# Patient Record
Sex: Male | Born: 1970
Health system: Southern US, Community
[De-identification: ages and names within clinical notes are randomized; demographics above are authoritative.]

## PROBLEM LIST (undated history)

## (undated) DIAGNOSIS — J45909 Unspecified asthma, uncomplicated: Secondary | ICD-10-CM

---

## 2013-07-25 ENCOUNTER — Ambulatory Visit: Payer: Self-pay

## 2013-11-02 DIAGNOSIS — J45909 Unspecified asthma, uncomplicated: Secondary | ICD-10-CM | POA: Insufficient documentation

## 2015-06-10 IMAGING — CR DG CHEST 2V
1 series · 2 of 2 positions shown · non-contrast
Comparison: None.

CLINICAL DATA: Cough, wheezing.

EXAM:
CHEST  2 VIEW

[Series 1: pa · 0.17mm/px · 2 of 2 slices shown]
[im 1/2]
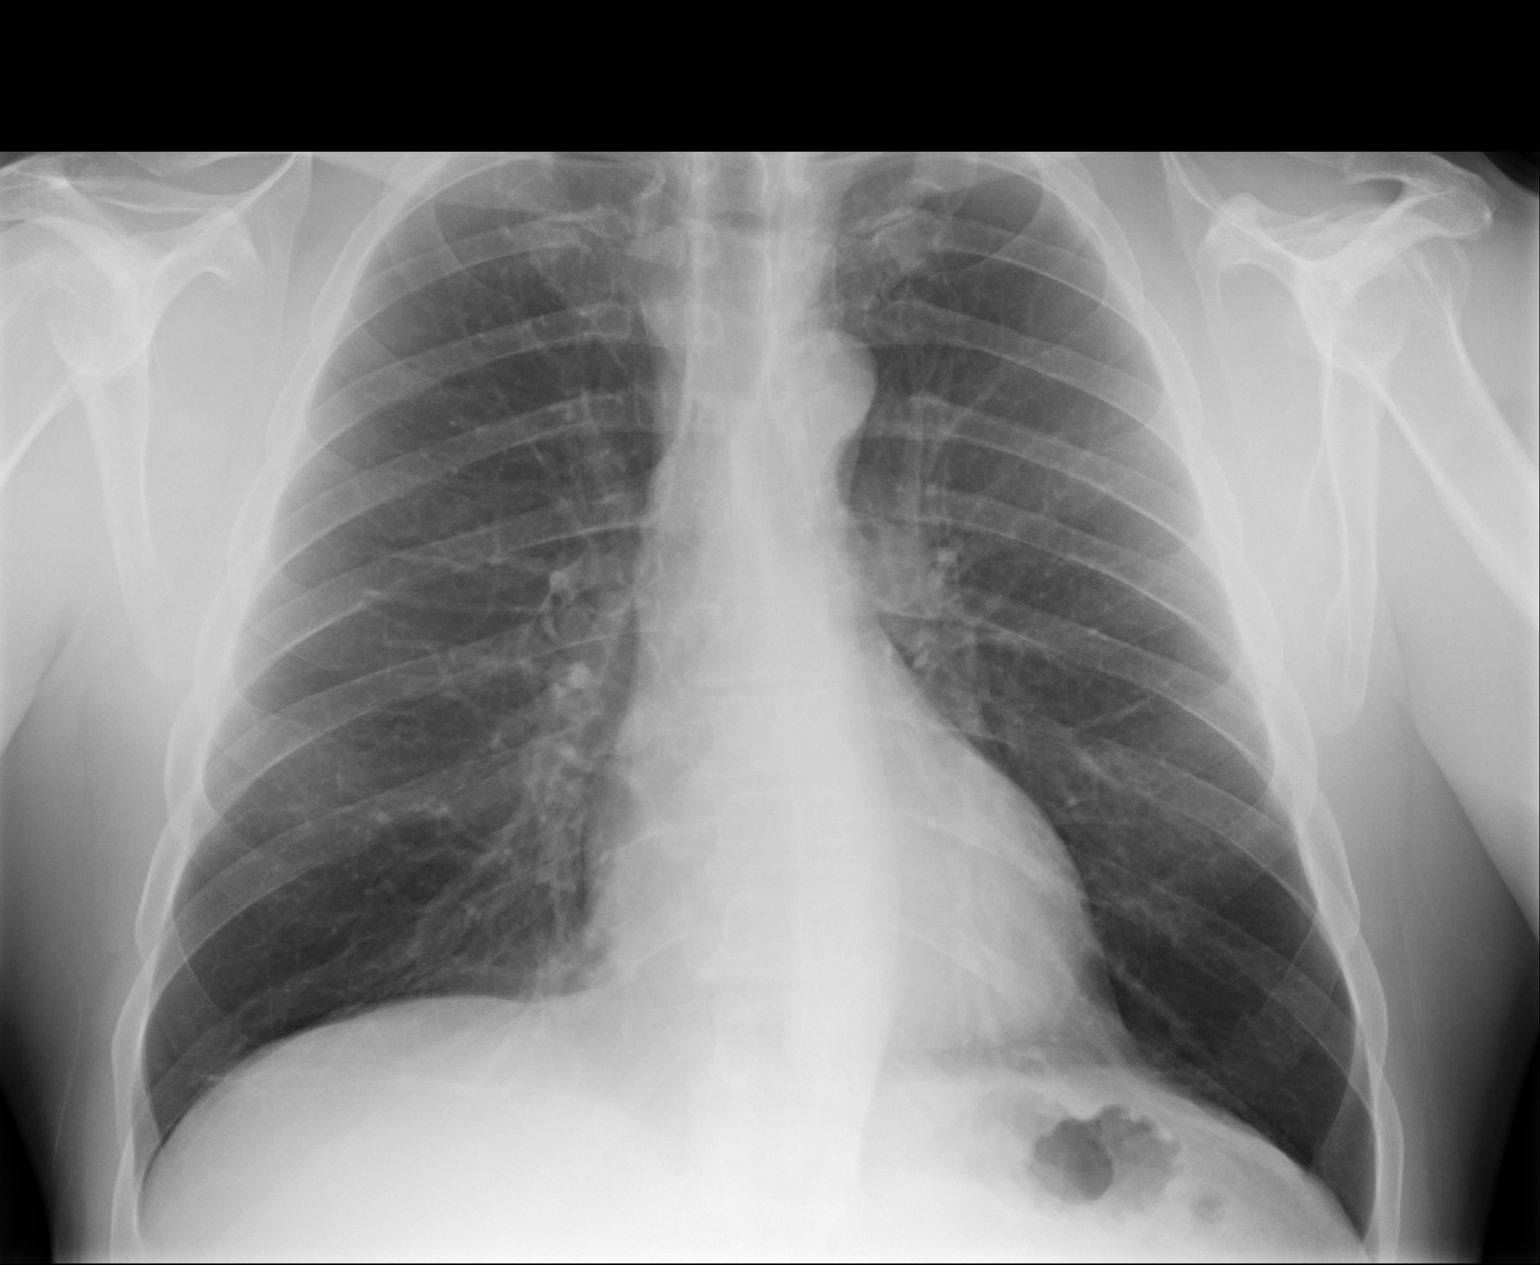
[im 2/2]
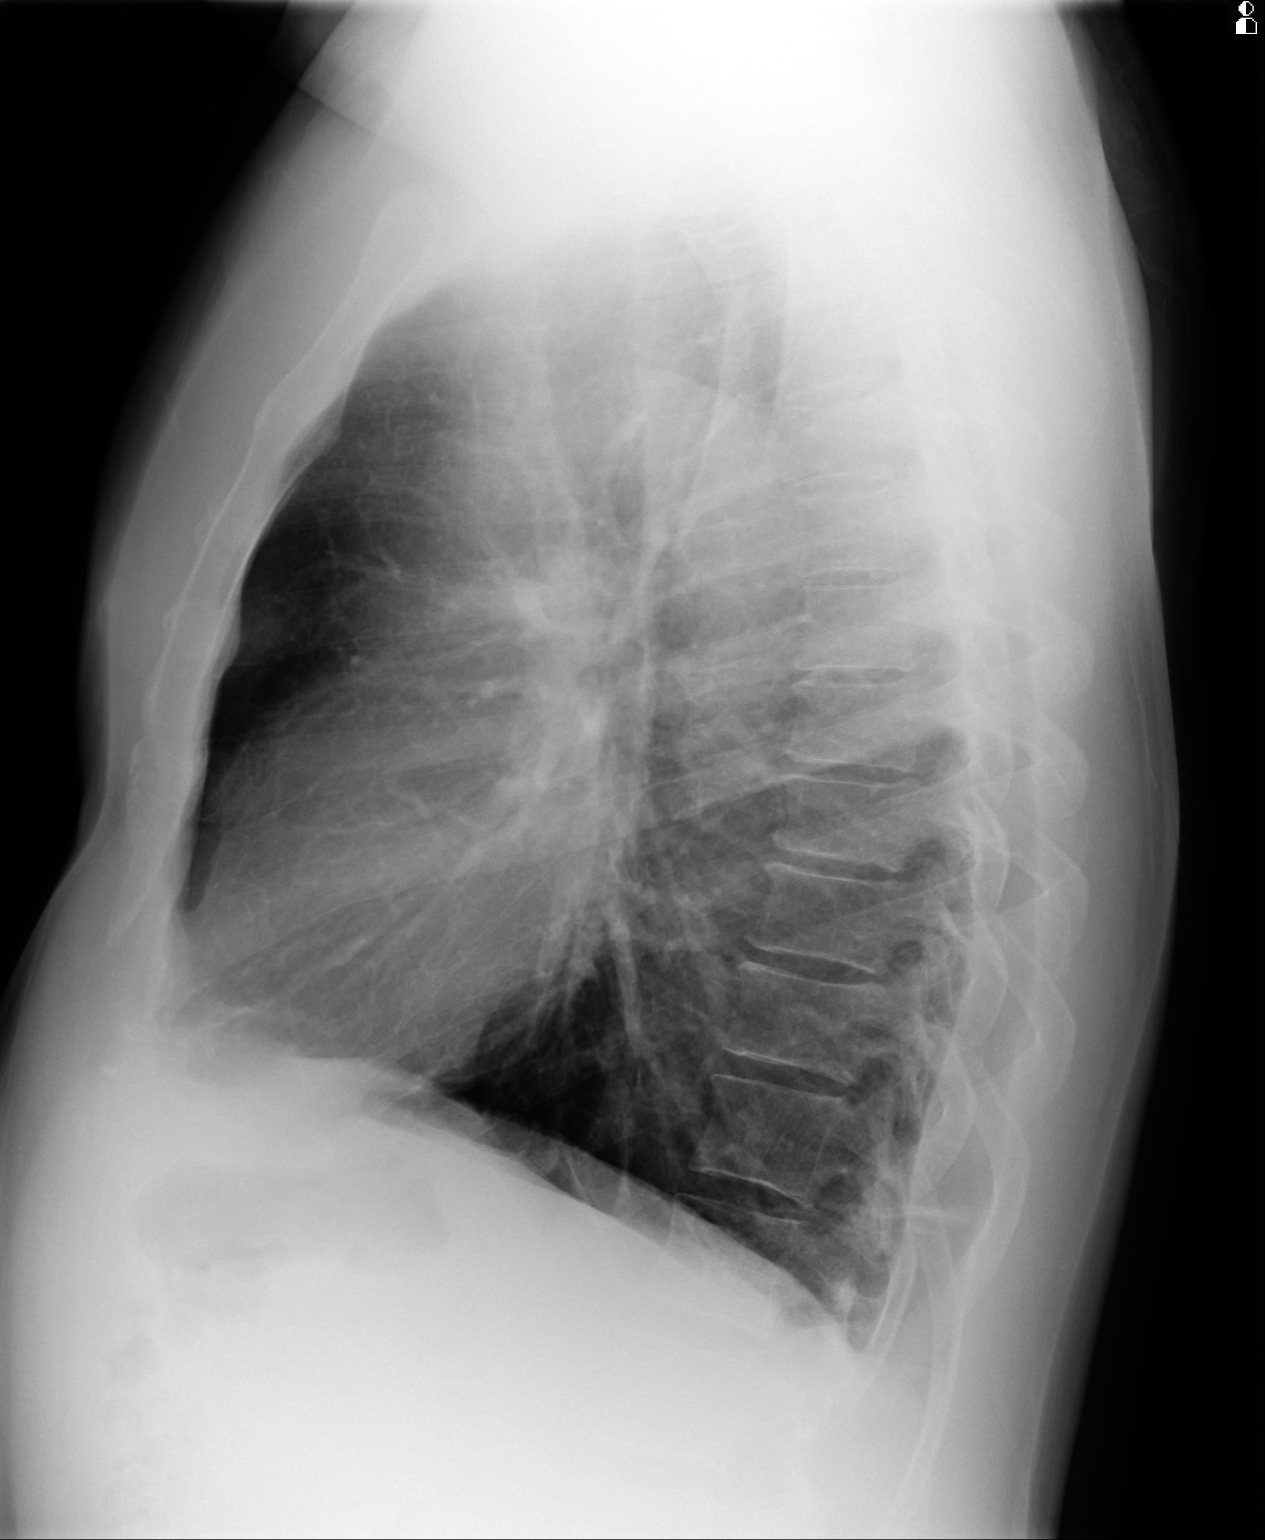

[2 of 2 positions shown; findings below may reference images not displayed]

FINDINGS: The heart size and mediastinal contours are within normal limits.
Both lungs are clear. The visualized skeletal structures are
unremarkable.
IMPRESSION: No acute cardiopulmonary abnormality seen.

## 2016-11-17 DIAGNOSIS — Z85820 Personal history of malignant melanoma of skin: Secondary | ICD-10-CM | POA: Insufficient documentation

## 2017-03-27 DIAGNOSIS — Z85820 Personal history of malignant melanoma of skin: Secondary | ICD-10-CM | POA: Diagnosis not present

## 2017-09-14 ENCOUNTER — Ambulatory Visit
Admission: EM | Admit: 2017-09-14 | Discharge: 2017-09-14 | Disposition: A | Discharge status: Home / Self Care | Payer: 59 | Attending: Family Medicine | Admitting: Family Medicine

## 2017-09-14 DIAGNOSIS — S90562A Insect bite (nonvenomous), left ankle, initial encounter: Secondary | ICD-10-CM | POA: Diagnosis not present

## 2017-09-14 DIAGNOSIS — W57XXXA Bitten or stung by nonvenomous insect and other nonvenomous arthropods, initial encounter: Secondary | ICD-10-CM

## 2017-09-14 DIAGNOSIS — Z23 Encounter for immunization: Secondary | ICD-10-CM | POA: Diagnosis not present

## 2017-09-14 DIAGNOSIS — L03116 Cellulitis of left lower limb: Secondary | ICD-10-CM | POA: Diagnosis not present

## 2017-09-14 MED ORDER — DOXYCYCLINE HYCLATE 100 MG PO CAPS: 100.0000 mg | ORAL_CAPSULE | Freq: Two times a day (BID) | ORAL | 0 refills | Status: DC

## 2017-09-14 MED ORDER — PREDNISONE 20 MG PO TABS: 40.0000 mg | ORAL_TABLET | Freq: Every day | ORAL | 0 refills | Status: DC

## 2017-09-14 MED ORDER — TETANUS-DIPHTH-ACELL PERTUSSIS 5-2.5-18.5 LF-MCG/0.5 IM SUSP
0.5000 mL | Freq: Once | INTRAMUSCULAR | Status: AC
  Administered 2017-09-14: 0.5 mL via INTRAMUSCULAR

## 2017-09-14 MED ORDER — MUPIROCIN 2 % EX OINT: TOPICAL_OINTMENT | CUTANEOUS | 0 refills | Status: AC

## 2017-09-14 NOTE — ED Provider Notes (Addendum)
[facility] ____________________________________________  Time seen: Approximately 7:47 PM  I have reviewed the triage vital signs and the nursing notes.  HISTORY  Chief Complaint Insect Bite  HPI Andrew Chase is a 47 y.o. male presented for evaluation of insect bite or sting to left ankle that is been present since last night.  Patient reports that he was walking outside going up some steps and felt a sudden sting to his posterior ankle.  States that he did see "dirt divers "in that area and suspects that is what he was stung by.  Denies any other insects.  States last night the area was itchy and had one small sting appearing area.  States even this morning the area was overall feeling well, but reports he works on his feet all day and noticed some swelling to the area with blisters. Reports did take benadryl today. Denies any other alleviating measures.  Denies any other insect bites or stings, no recent tick bites.  Denies fevers.  Reports otherwise feels well.  No chest pain, shortness of breath, difficulty swallowing, difficulty breathing, facial swelling, history of similar in the past.  States tetanus immunization last greater than 10 years ago.  States the area is minimally tender, denies other complaints.  No pain radiation paresthesias.Denies recent sickness. Denies recent antibiotic use.   Rusty Aus, MD: PCP   History reviewed. No pertinent past medical history.  Patient Active Problem List   Diagnosis Date Noted  . History of malignant melanoma 11/17/2016  . Reactive airway disease 11/02/2013    History reviewed. No pertinent surgical history.   No current facility-administered medications for this encounter.   Current Outpatient Medications:  .  fluticasone (FLONASE) 50 MCG/ACT nasal spray, Place into the nose., Disp: , Rfl:  .  doxycycline (VIBRAMYCIN) 100 MG capsule, Take 1 capsule (100 mg total) by mouth 2 (two) times daily., Disp: 20 capsule,  Rfl: 0 .  mupirocin ointment (BACTROBAN) 2 %, Apply two times a day for 7 days., Disp: 22 g, Rfl: 0 .  predniSONE (DELTASONE) 20 MG tablet, Take 2 tablets (40 mg total) by mouth daily., Disp: 10 tablet, Rfl: 0  Allergies Patient has no known allergies.  No family history on file.  Social History Social History   Tobacco Use  . Smoking status: Never Smoker  . Smokeless tobacco: Never Used  Substance Use Topics  . Alcohol use: Yes  . Drug use: Never    Review of Systems Constitutional: No fever/chills. Cardiovascular: Denies chest pain. Respiratory: Denies shortness of breath. Gastrointestinal: No abdominal pain.  Musculoskeletal: Negative for back pain. Skin: as above.  ____________________________________________   PHYSICAL EXAM:  VITAL SIGNS: ED Triage Vitals  Enc Vitals Group     BP 09/14/17 1842 129/82     Pulse Rate 09/14/17 1842 73     Resp 09/14/17 1842 18     Temp 09/14/17 1842 97.9 F (36.6 C)     Temp Source 09/14/17 1842 Oral     SpO2 09/14/17 1842 98 %     Weight --      Height --      Head Circumference --      Peak Flow --      Pain Score 09/14/17 1844 0     Pain Loc --      Pain Edu? --      Excl. in [place]? --     Constitutional: Alert and oriented. Well appearing and in no acute distress. ENT  Head: Normocephalic and atraumatic.. Cardiovascular: Normal rate, regular rhythm. Grossly normal heart sounds.  Good peripheral circulation. Respiratory: Normal respiratory effort without tachypnea nor retractions. Breath sounds are clear and equal bilaterally. No wheezes, rales, rhonchi. Musculoskeletal:  Steady gait. Bilateral pedal pulses equal and easily palpated. Neurologic:  Normal speech and language. No gross focal neurologic deficits are appreciated. Speech is normal. No gait instability.  Skin:  Skin is warm, dry.   Left posterior ankle with the above appearance, single punctum appearing area without foreign body appearance, mild serous  drainage and mild surrounding erythema and localized edema mildly, no other edema noted, left lower extremity otherwise nontender, no calf tenderness.  Full range of motion to bilateral lower extremities Psychiatric: Mood and affect are normal. Speech and behavior are normal. Patient exhibits appropriate insight and judgment   ___________________________________________   LABS (all labs ordered are listed, but only abnormal results are displayed)  Labs Reviewed - No data to display   PROCEDURES Procedures     INITIAL IMPRESSION / [place] / ED COURSE  Pertinent labs & imaging results that were available during my care of the patient were reviewed by me and considered in my medical decision making (see chart for details).  Well-appearing patient.  No acute distress.  Patient presenting for suspect of left ankle insect sting last night.  Suspect patient with local allergic reaction also with concern of secondary cellulitis.  Will treat with oral prednisone, doxycycline and topical Bactroban.  Discussed elevation, wound care and close monitoring.  Discussed strict follow-up and return parameters.Discussed indication, risks and benefits of medications with patient.tdap updated.   Discussed follow up with Primary care physician this week. Discussed follow up and return parameters including no resolution or any worsening concerns. Patient verbalized understanding and agreed to plan.   ____________________________________________   FINAL CLINICAL IMPRESSION(S) / ED DIAGNOSES  Final diagnoses:  Insect bite of left ankle, initial encounter  Reaction to insect bite  Cellulitis of left lower extremity     ED Discharge Orders        Ordered    predniSONE (DELTASONE) 20 MG tablet  Daily     09/14/17 1913    doxycycline (VIBRAMYCIN) 100 MG capsule  2 times daily     09/14/17 1913    mupirocin ointment (BACTROBAN) 2 %     09/14/17 1913       Note: This dictation was  prepared with Dragon dictation along with smaller phrase technology. Any transcriptional errors that result from this process are unintentional.         Marylene Land, NP 09/14/17 1953    Marylene Land, NP 09/14/17 1953

## 2017-09-14 NOTE — Discharge Instructions (Addendum)
Take medication as prescribed. Rest. Drink plenty of fluids. Keep clean. Elevate leg as discussed. Close monitoring.  Follow up with your primary care physician this week as needed. Return to Urgent care for new or worsening concerns.

## 2017-09-14 NOTE — ED Triage Notes (Signed)
Pt here for bite or sting on his left ankle that occurred last night. Unsure what got him but does have raised pus areas and patient did pop the blisters and now has some partially covered by a band aid. States it currently doesn't itch and did take benadryl a few times today.

## 2017-12-19 ENCOUNTER — Ambulatory Visit
Admission: EM | Admit: 2017-12-19 | Discharge: 2017-12-19 | Disposition: A | Discharge status: Home / Self Care | Payer: 59 | Attending: Family Medicine | Admitting: Family Medicine

## 2017-12-19 ENCOUNTER — Other Ambulatory Visit: Payer: Self-pay

## 2017-12-19 DIAGNOSIS — B9789 Other viral agents as the cause of diseases classified elsewhere: Secondary | ICD-10-CM

## 2017-12-19 DIAGNOSIS — J069 Acute upper respiratory infection, unspecified: Secondary | ICD-10-CM | POA: Diagnosis not present

## 2017-12-19 DIAGNOSIS — R0981 Nasal congestion: Secondary | ICD-10-CM

## 2017-12-19 HISTORY — DX: Unspecified asthma, uncomplicated: J45.909

## 2017-12-19 MED ORDER — DOXYCYCLINE HYCLATE 100 MG PO CAPS: 100.0000 mg | ORAL_CAPSULE | Freq: Two times a day (BID) | ORAL | 0 refills | Status: AC

## 2017-12-19 MED ORDER — ALBUTEROL SULFATE HFA 108 (90 BASE) MCG/ACT IN AERS: 1.0000 | INHALATION_SPRAY | Freq: Four times a day (QID) | RESPIRATORY_TRACT | 0 refills | Status: AC | PRN

## 2017-12-19 MED ORDER — HYDROCOD POLST-CPM POLST ER 10-8 MG/5ML PO SUER
5.0000 mL | Freq: Every evening | ORAL | 0 refills | Status: AC | PRN
Start: 2017-12-19 — End: ?

## 2017-12-19 MED ORDER — PSEUDOEPH-BROMPHEN-DM 30-2-10 MG/5ML PO SYRP: 5.0000 mL | ORAL_SOLUTION | Freq: Four times a day (QID) | ORAL | 0 refills | Status: AC | PRN

## 2017-12-19 MED ORDER — PREDNISONE 20 MG PO TABS: 40.0000 mg | ORAL_TABLET | Freq: Every day | ORAL | 0 refills | Status: AC

## 2017-12-19 NOTE — ED Triage Notes (Signed)
Pt with past few days of nasal congestion and blowing green from his nose. Denies pain

## 2017-12-19 NOTE — ED Provider Notes (Signed)
[facility]    CSN: [id number] Arrival date & time: 12/19/17  1036     History   Chief Complaint Chief Complaint  Patient presents with  . Nasal Congestion    HPI Andrew Chase is a 47 y.o. male presents to the urgent care facility for evaluation of nasal congestion.  He has had symptoms for 2 days.  States is been blowing green mucus from his nose.  He denies any sinus pain.  He has had a mild cough that is worse at nighttime, nonproductive.  He has had low-grade fevers but afebrile today.  He is been taking DayQuil, NyQuil and Afrin with some improvement of symptoms.  He denies any body aches, nausea, vomiting, diarrhea, skin rashes.  No headaches.  HPI  Past Medical History:  Diagnosis Date  . Asthma     Patient Active Problem List   Diagnosis Date Noted  . History of malignant melanoma 11/17/2016  . Reactive airway disease 11/02/2013    History reviewed. No pertinent surgical history.     Home Medications    Prior to Admission medications   Medication Sig Start Date End Date Taking? Authorizing Provider  albuterol (PROVENTIL HFA;VENTOLIN HFA) 108 (90 Base) MCG/ACT inhaler Inhale 1-2 puffs into the lungs every 6 (six) hours as needed for wheezing or shortness of breath. 12/19/17   Duanne Guess, PA-C  brompheniramine-pseudoephedrine-DM 30-2-10 MG/5ML syrup Take 5 mLs by mouth 4 (four) times daily as needed. 12/19/17   Duanne Guess, PA-C  chlorpheniramine-HYDROcodone (TUSSIONEX PENNKINETIC ER) 10-8 MG/5ML SUER Take 5 mLs by mouth at bedtime as needed for cough. 12/19/17   Duanne Guess, PA-C  doxycycline (VIBRAMYCIN) 100 MG capsule Take 1 capsule (100 mg total) by mouth 2 (two) times daily for 10 days. 12/19/17 12/29/17  Duanne Guess, PA-C  fluticasone (FLONASE) 50 MCG/ACT nasal spray Place into the nose. 11/12/15   [provider]  mupirocin ointment (BACTROBAN) 2 % Apply two times a day for 7 days. 09/14/17   Marylene Land, NP    predniSONE (DELTASONE) 20 MG tablet Take 2 tablets (40 mg total) by mouth daily. 12/19/17   Dorise Hiss C, PA-C  SYMBICORT 160-4.5 MCG/ACT inhaler INHALE 2 INHALATIONS INTO THE LUNGS 2 (TWO) TIMES DAILY. 12/03/17   [provider]    Family History History reviewed. No pertinent family history.  Social History Social History   Tobacco Use  . Smoking status: Former Smoker    Last attempt to quit: 12/20/1991    Years since quitting: 26.0  . Smokeless tobacco: Never Used  Substance Use Topics  . Alcohol use: Yes    Comment: social  . Drug use: Never     Allergies   Patient has no known allergies.   Review of Systems Review of Systems  Constitutional: Negative for fever.  HENT: Positive for congestion and rhinorrhea. Negative for sinus pressure, sinus pain and sore throat.   Respiratory: Positive for cough. Negative for wheezing and stridor.   Gastrointestinal: Negative for diarrhea, nausea and vomiting.  Musculoskeletal: Negative for arthralgias, myalgias and neck stiffness.  Skin: Negative for rash.  Neurological: Negative for dizziness.     Physical Exam Triage Vital Signs ED Triage Vitals  Enc Vitals Group     BP 12/19/17 1049 126/90     Pulse Rate 12/19/17 1049 88     Resp 12/19/17 1049 18     Temp 12/19/17 1049 98.6 F (37 C)     Temp Source  12/19/17 1049 Oral     SpO2 12/19/17 1049 95 %     Weight 12/19/17 1050 160 lb (72.6 kg)     Height 12/19/17 1050 5\' 6"  (1.676 m)     Head Circumference --      Peak Flow --      Pain Score 12/19/17 1050 0     Pain Loc --      Pain Edu? --      Excl. in [place]? --    No data found.  Updated Vital Signs BP 126/90 (BP Location: Left Arm)   Pulse 88   Temp 98.6 F (37 C) (Oral)   Resp 18   Ht 5\' 6"  (1.676 m)   Wt 160 lb (72.6 kg)   SpO2 95%   BMI 25.82 kg/m   Visual Acuity Right Eye Distance:   Left Eye Distance:   Bilateral Distance:    Right Eye Near:   Left Eye Near:    Bilateral Near:      Physical Exam  Constitutional: He is oriented to person, place, and time. He appears well-developed and well-nourished. No distress.  HENT:  Head: Normocephalic and atraumatic.  Right Ear: Hearing, tympanic membrane, external ear and ear canal normal.  Left Ear: Hearing, tympanic membrane, external ear and ear canal normal.  Nose: Rhinorrhea present. No nasal septal hematoma. Right sinus exhibits no maxillary sinus tenderness and no frontal sinus tenderness. Left sinus exhibits no maxillary sinus tenderness and no frontal sinus tenderness.  Mouth/Throat: No trismus in the jaw. No uvula swelling. No oropharyngeal exudate, posterior oropharyngeal edema, posterior oropharyngeal erythema or tonsillar abscesses.  Mild rhinorrhea.  No frontal or maxillary sinus tenderness.  Pharynx is normal with no exudates.  Uvula is midline.  Eyes: Conjunctivae are normal.  Neck: Normal range of motion.  Cardiovascular: Normal rate and regular rhythm.  Pulmonary/Chest: No stridor. No respiratory distress. He has wheezes (.  Subtle wheezing no rales or rhonchi.). He exhibits no tenderness.  Abdominal: Soft. He exhibits no distension. There is no tenderness. There is no guarding.  Musculoskeletal: Normal range of motion.  Lymphadenopathy:    He has cervical adenopathy.  Neurological: He is alert and oriented to person, place, and time.  Skin: Skin is warm and dry. No rash noted.  Psychiatric: He has a normal mood and affect. His behavior is normal. Judgment and thought content normal.     UC Treatments / Results  Labs (all labs ordered are listed, but only abnormal results are displayed) Labs Reviewed - No data to display  EKG None  Radiology No results found.  Procedures Procedures (including critical care time)  Medications Ordered in UC Medications - No data to display  Initial Impression / Assessment and Plan / UC Course  I have reviewed the triage vital signs and the nursing  notes.  Pertinent labs & imaging results that were available during my care of the patient were reviewed by me and considered in my medical decision making (see chart for details).     47 year old male with sinus congestion, cough x2 days.  Subjective fevers at home but afebrile here and vital signs are normal.  Subtle wheezing on exam, history of asthma uses Singulair at home.  No albuterol.  Patient treated with prednisone, albuterol, Bromfed and Tussionex.  Patient will use these medications to treat symptoms of his viral illness and if no improvement over the next 4 days patient can start antibiotic.  I tried patient on the fact that  is green mucus does not indicate need for antibiotic Final Clinical Impressions(s) / UC Diagnoses   Final diagnoses:  Viral upper respiratory tract infection  Nasal congestion     Discharge Instructions     Please continue to drink lots of fluids.  Use Tylenol or ibuprofen as needed for any fevers.  Take Bromfed cough syrup as prescribed during the day as needed.  Use Tussionex cough syrup at nighttime as needed.  Take prednisone daily as prescribed for 5 days.  Use albuterol as needed.  If any difficulty breathing, wheezing, fevers above 102.1 that are not going down Tylenol or ibuprofen return to the emergency department.  If you have not seen any signs of improvement in your symptoms over the next 4 days he may start doxycycline.  If you are beginning to see signs of improvement in your symptoms in the next 4 days do not start doxycycline.   ED Prescriptions    Medication Sig Dispense Auth. Provider   predniSONE (DELTASONE) 20 MG tablet Take 2 tablets (40 mg total) by mouth daily. 10 tablet Dorise Hiss C, PA-C   albuterol (PROVENTIL HFA;VENTOLIN HFA) 108 (90 Base) MCG/ACT inhaler Inhale 1-2 puffs into the lungs every 6 (six) hours as needed for wheezing or shortness of breath. 1 Inhaler Duanne Guess, PA-C   chlorpheniramine-HYDROcodone (TUSSIONEX  PENNKINETIC ER) 10-8 MG/5ML SUER Take 5 mLs by mouth at bedtime as needed for cough. 60 mL Dorise Hiss C, PA-C   brompheniramine-pseudoephedrine-DM 30-2-10 MG/5ML syrup Take 5 mLs by mouth 4 (four) times daily as needed. 120 mL Dorise Hiss C, PA-C   doxycycline (VIBRAMYCIN) 100 MG capsule Take 1 capsule (100 mg total) by mouth 2 (two) times daily for 10 days. 20 capsule Duanne Guess, PA-C     Controlled Substance Prescriptions [place] Controlled Substance Registry consulted? Not Applicable   Renata Caprice 12/19/17 1118

## 2017-12-19 NOTE — Discharge Instructions (Addendum)
Please continue to drink lots of fluids.  Use Tylenol or ibuprofen as needed for any fevers.  Take Bromfed cough syrup as prescribed during the day as needed.  Use Tussionex cough syrup at nighttime as needed.  Take prednisone daily as prescribed for 5 days.  Use albuterol as needed.  If any difficulty breathing, wheezing, fevers above 102.1 that are not going down Tylenol or ibuprofen return to the emergency department.  If you have not seen any signs of improvement in your symptoms over the next 4 days he may start doxycycline.  If you are beginning to see signs of improvement in your symptoms in the next 4 days do not start doxycycline.

## 2017-12-22 DIAGNOSIS — Z0000 Encounter for general adult medical examination without abnormal findings: Secondary | ICD-10-CM | POA: Diagnosis not present

## 2018-03-26 DIAGNOSIS — D2262 Melanocytic nevi of left upper limb, including shoulder: Secondary | ICD-10-CM | POA: Diagnosis not present

## 2018-03-26 DIAGNOSIS — D2261 Melanocytic nevi of right upper limb, including shoulder: Secondary | ICD-10-CM | POA: Diagnosis not present

## 2018-03-26 DIAGNOSIS — Z85820 Personal history of malignant melanoma of skin: Secondary | ICD-10-CM | POA: Diagnosis not present
